# Patient Record
Sex: Female | Born: 1935 | Race: Black or African American | Hispanic: No | Smoking: Never smoker
Health system: Southern US, Community
[De-identification: ages and names within clinical notes are randomized; demographics above are authoritative.]

## PROBLEM LIST (undated history)

## (undated) DIAGNOSIS — G309 Alzheimer's disease, unspecified: Principal | ICD-10-CM

## (undated) DIAGNOSIS — F0280 Dementia in other diseases classified elsewhere without behavioral disturbance: Principal | ICD-10-CM

## (undated) HISTORY — DX: Alzheimer's disease, unspecified: G30.9

## (undated) HISTORY — DX: Dementia in other diseases classified elsewhere without behavioral disturbance: F02.80

---

## 1999-08-08 ENCOUNTER — Encounter: Payer: Self-pay | Admitting: Internal Medicine

## 1999-08-08 ENCOUNTER — Encounter: Admission: RE | Admit: 1999-08-08 | Discharge: 1999-08-08 | Payer: Self-pay | Admitting: Internal Medicine

## 2000-09-03 ENCOUNTER — Encounter: Admission: RE | Admit: 2000-09-03 | Discharge: 2000-09-03 | Payer: Self-pay | Admitting: Internal Medicine

## 2000-09-03 ENCOUNTER — Encounter: Payer: Self-pay | Admitting: Internal Medicine

## 2001-09-21 ENCOUNTER — Encounter: Admission: RE | Admit: 2001-09-21 | Discharge: 2001-09-21 | Payer: Self-pay | Admitting: Internal Medicine

## 2001-09-21 ENCOUNTER — Encounter: Payer: Self-pay | Admitting: Internal Medicine

## 2003-07-12 ENCOUNTER — Encounter: Admission: RE | Admit: 2003-07-12 | Discharge: 2003-07-12 | Payer: Self-pay | Admitting: Internal Medicine

## 2004-10-02 ENCOUNTER — Encounter: Admission: RE | Admit: 2004-10-02 | Discharge: 2004-10-02 | Payer: Self-pay | Admitting: Internal Medicine

## 2005-10-03 ENCOUNTER — Encounter: Admission: RE | Admit: 2005-10-03 | Discharge: 2005-10-03 | Payer: Self-pay | Admitting: Internal Medicine

## 2006-07-26 ENCOUNTER — Emergency Department (HOSPITAL_COMMUNITY): Admission: EM | Admit: 2006-07-26 | Discharge: 2006-07-26 | Payer: Self-pay | Admitting: Emergency Medicine

## 2006-10-06 ENCOUNTER — Encounter: Admission: RE | Admit: 2006-10-06 | Discharge: 2006-10-06 | Payer: Self-pay | Admitting: Internal Medicine

## 2007-09-11 ENCOUNTER — Encounter: Admission: RE | Admit: 2007-09-11 | Discharge: 2007-09-11 | Payer: Self-pay | Admitting: Internal Medicine

## 2007-10-08 ENCOUNTER — Ambulatory Visit (HOSPITAL_COMMUNITY): Admission: RE | Admit: 2007-10-08 | Discharge: 2007-10-08 | Payer: Self-pay | Admitting: Ophthalmology

## 2008-09-12 ENCOUNTER — Encounter: Admission: RE | Admit: 2008-09-12 | Discharge: 2008-09-12 | Payer: Self-pay | Admitting: Internal Medicine

## 2008-09-28 ENCOUNTER — Encounter: Admission: RE | Admit: 2008-09-28 | Discharge: 2008-09-28 | Payer: Self-pay | Admitting: Internal Medicine

## 2008-11-11 ENCOUNTER — Ambulatory Visit (HOSPITAL_COMMUNITY): Admission: RE | Admit: 2008-11-11 | Discharge: 2008-11-11 | Payer: Self-pay | Admitting: Ophthalmology

## 2009-09-13 ENCOUNTER — Ambulatory Visit (HOSPITAL_COMMUNITY): Admission: RE | Admit: 2009-09-13 | Discharge: 2009-09-13 | Payer: Self-pay | Admitting: Internal Medicine

## 2009-09-13 ENCOUNTER — Encounter (INDEPENDENT_AMBULATORY_CARE_PROVIDER_SITE_OTHER): Payer: Self-pay | Admitting: Internal Medicine

## 2009-09-13 ENCOUNTER — Ambulatory Visit: Payer: Self-pay | Admitting: Surgery

## 2009-09-14 ENCOUNTER — Encounter: Admission: RE | Admit: 2009-09-14 | Discharge: 2009-09-14 | Payer: Self-pay | Admitting: Internal Medicine

## 2010-06-29 NOTE — Op Note (Signed)
Shannon Wood, LIPPARD                   ACCOUNT NO.:  [id number]   MEDICAL RECORD NO.:  [id number]          PATIENT TYPE:  AMB   LOCATION:  SDS                          FACILITY:  [facility]   PHYSICIAN:  Salley Scarlet., M.D.DATE OF BIRTH:  [birth year]   DATE OF PROCEDURE:  DATE OF DISCHARGE:  10/08/2007                               OPERATIVE REPORT   PREOPERATIVE DIAGNOSIS:  Immature cataract, left eye.   POSTOPERATIVE DIAGNOSIS:  Immature cataract, left eye.   OPERATION:  Kelman phacoemulsification cataract, left eye.   ANESTHESIA:  Local using Xylocaine 2% with Marcaine 0.75% and Wydase.   JUSTIFICATION FOR PROCEDURE:  This is a 76 year old lady who has  glaucoma who has complained of difficulty seeing to read and drive.  She  was evaluated and found to have bilateral posterior subcapsular  cataracts with a visual acuity best corrected 20/30 on the right and  20/50 on the left.  Cataract extraction with intraocular lens  implantation was recommended.  She is admitted at this time for that  purpose.   PROCEDURE:  Under the influence of IV sedation, Van Lint akinesia and  retrobulbar anesthesia was given.  The patient was prepped and draped in  the usual manner.  The lid speculum was inserted under the upper and  lower lid of the left eye and a 4-0 silk traction suture was passed  through the belly of the superior rectus muscle for traction.  A fornix-  based conjunctival flap was turned, and hemostasis was achieved using  cautery.  An incision made in the sclera at the limbus.  This incision  was dissected down to clear cornea using crescent blade.  A sideport  incision made at the 1:30 position.  OcuCoat was injected into the eye  through the sideport incision.  The anterior chamber was entered through  the corneoscleral tunnel incision at 11:30 o'clock position.  An  anterior capsulotomy was done using bent 25-gauge needle.  The nucleus  was hydrodissected using Xylocaine.  The  KPE handpiece was passed into  the eye and the nucleus was emulsified without difficulty.  The residual  cortical material was aspirated.  The posterior capsule was polished  using an olive tip polisher.  The wound was widened slightly to  accommodate a foldable silicone lens.  The lens seated into the eye  behind the iris without difficulty.  The anterior chamber was reformed  and pupils constricted using Miochol.  The lips of the wound were  hydrated and tested to make sure that there was no leak.  After  ascertaining there was no leak, the conjunctiva was closed over wound  using thermal cautery.  Celestone 1 mL and 0.5 mL of gentamicin were  injected subconjunctivally.  Maxitrol ophthalmic ointment prilocaine  ointment were applied along with a patch and Fox shield.  The patient  tolerated the procedure well and was discharged to the post anesthesia  recovery in satisfactory condition.  She was instructed to rest today,  to take Vicodin every 4 hours as needed for pain, and to see me in  office  tomorrow for further evaluation.   DISCHARGE DIAGNOSIS:  Immature cataract, left eye.     Salley Scarlet., M.D.  Electronically Signed    TB/MEDQ  D:  10/09/2007  T:  10/10/2007  Job:  [id number]

## 2010-08-23 ENCOUNTER — Other Ambulatory Visit: Payer: Self-pay | Admitting: Internal Medicine

## 2010-08-23 DIAGNOSIS — Z1231 Encounter for screening mammogram for malignant neoplasm of breast: Secondary | ICD-10-CM

## 2010-09-18 ENCOUNTER — Ambulatory Visit
Admission: RE | Admit: 2010-09-18 | Discharge: 2010-09-18 | Disposition: A | Discharge status: Home / Self Care | Payer: Medicare Other | Source: Ambulatory Visit | Attending: Internal Medicine | Admitting: Internal Medicine

## 2010-09-18 DIAGNOSIS — Z1231 Encounter for screening mammogram for malignant neoplasm of breast: Secondary | ICD-10-CM

## 2011-09-10 ENCOUNTER — Other Ambulatory Visit: Payer: Self-pay | Admitting: Internal Medicine

## 2011-09-10 DIAGNOSIS — Z1231 Encounter for screening mammogram for malignant neoplasm of breast: Secondary | ICD-10-CM

## 2011-09-24 ENCOUNTER — Ambulatory Visit: Payer: Medicare Other

## 2011-11-04 ENCOUNTER — Ambulatory Visit
Admission: RE | Admit: 2011-11-04 | Discharge: 2011-11-04 | Disposition: A | Discharge status: Home / Self Care | Payer: Medicare Other | Source: Ambulatory Visit | Attending: Internal Medicine | Admitting: Internal Medicine

## 2011-11-04 DIAGNOSIS — Z1231 Encounter for screening mammogram for malignant neoplasm of breast: Secondary | ICD-10-CM

## 2014-03-16 DIAGNOSIS — I10 Essential (primary) hypertension: Secondary | ICD-10-CM | POA: Diagnosis not present

## 2014-05-09 ENCOUNTER — Other Ambulatory Visit: Payer: Self-pay | Admitting: Internal Medicine

## 2014-05-09 DIAGNOSIS — F0390 Unspecified dementia without behavioral disturbance: Secondary | ICD-10-CM

## 2014-05-09 DIAGNOSIS — Z0000 Encounter for general adult medical examination without abnormal findings: Secondary | ICD-10-CM | POA: Diagnosis not present

## 2014-05-13 ENCOUNTER — Other Ambulatory Visit: Payer: Self-pay

## 2014-05-16 ENCOUNTER — Ambulatory Visit
Admission: RE | Admit: 2014-05-16 | Discharge: 2014-05-16 | Disposition: A | Discharge status: Home / Self Care | Payer: Medicare Other | Source: Ambulatory Visit | Attending: Internal Medicine | Admitting: Internal Medicine

## 2014-05-16 DIAGNOSIS — F0390 Unspecified dementia without behavioral disturbance: Secondary | ICD-10-CM

## 2014-05-16 DIAGNOSIS — R413 Other amnesia: Secondary | ICD-10-CM | POA: Diagnosis not present

## 2014-05-23 DIAGNOSIS — F0390 Unspecified dementia without behavioral disturbance: Secondary | ICD-10-CM | POA: Diagnosis not present

## 2014-06-20 ENCOUNTER — Ambulatory Visit (INDEPENDENT_AMBULATORY_CARE_PROVIDER_SITE_OTHER): Payer: Medicare Other | Admitting: Neurology

## 2014-06-20 ENCOUNTER — Encounter: Payer: Self-pay | Admitting: Neurology

## 2014-06-20 VITALS — BP 140/68 | HR 64 | Ht 64.0 in | Wt 135.8 lb

## 2014-06-20 DIAGNOSIS — G300 Alzheimer's disease with early onset: Secondary | ICD-10-CM | POA: Diagnosis not present

## 2014-06-20 DIAGNOSIS — F0280 Dementia in other diseases classified elsewhere without behavioral disturbance: Secondary | ICD-10-CM

## 2014-06-20 DIAGNOSIS — G309 Alzheimer's disease, unspecified: Secondary | ICD-10-CM | POA: Diagnosis not present

## 2014-06-20 HISTORY — DX: Dementia in other diseases classified elsewhere without behavioral disturbance: F02.80

## 2014-06-20 NOTE — Patient Instructions (Signed)
Alzheimer Disease Alzheimer disease is a mental disorder. It causes memory loss and loss of other mental functions, such as learning, thinking, problem solving, communicating, and completing tasks. The mental losses interfere with the ability to perform daily activities at work, at home, or in social situations. Alzheimer disease usually starts in a person's late 60s or early 70s but can start earlier in life (familial form). The mental changes caused by this disease are permanent and worsen over time. As the illness progresses, the ability to do even the simplest things is lost. Survival with Alzheimer disease ranges from several years to as long as 20 years. CAUSES Alzheimer disease is caused by abnormally high levels of a protein (beta-amyloid) in the brain. This protein forms very small deposits within and around the brain's nerve cells. These deposits prevent the nerve cells from working properly. Experts are not certain what causes the beta-amyloid deposits in this disease. RISK FACTORS The following major risk factors have been identified:  Increasing age.  Certain genetic variations, such as Down syndrome (trisomy 21). SYMPTOMS In the early stages of Alzheimer disease, you are still able to perform daily activities but need greater effort, more time, or memory aids. Early symptoms include:  Mild memory loss of recent events, names, or phone numbers.  Loss of objects.  Minor loss of vocabulary.  Difficulty with complex tasks, such as paying bills or driving in unfamiliar locations. Other mental functions deteriorate as the disease worsens. These changes slowly go from mild to severe. Symptoms at this stage include:  Difficulty remembering. You may not be able to recall personal information such as your address and telephone number. You may become confused about the date, the season of the year, or your location.  Difficulty maintaining attention. You may forget what you wanted to say  during conversations and repeat what you have already said.  Difficulty learning new information or tasks. You may not remember what you read or the name of a new friend you met.  Difficulty counting or doing math. You may have difficulty with complex math problems. You may make mistakes in paying bills or managing your checkbook.  Poor reasoning and judgment. You may make poor decisions or not dress right for the weather.  Difficulty communicating. You may have regular difficulty remembering words, naming objects, expressing yourself clearly, or writing sentences that make sense.  Difficulty performing familiar daily activities. You may get lost driving in familiar locations or need help eating, bathing, dressing, grooming, or using the toilet. You may have difficulty maintaining bladder or bowel control.  Difficulty recognizing familiar faces. You may confuse family members or close friends with one another. You may not recognize a close relative or may mistake strangers for family. Alzheimer disease also may cause changes in personality and behavior. These changes include:   Loss of interest or motivation.  Social withdrawal.  Anxiety.  Difficulty sleeping.  Uncharacteristic anger or combativeness.  A false belief that someone is trying to harm you (paranoia).  Seeing things that are not real (hallucinations).  Agitation. Confusion and disruptive behavior are often worse at night and may be triggered by changes in the environment or acute medical issues. DIAGNOSIS  Alzheimer disease is diagnosed through an assessment by your health care provider. During this assessment, your health care provider will do the following:  Ask you and your family, friends, or caregivers questions about your symptoms, their frequency, their duration and progression, and the effect they are having on your life.    Ask questions about your personal and family medical history and use of alcohol or drugs,  including prescription medicine.  Perform a physical exam and order blood tests and brain imaging exams. Your health care provider may refer you to a specialist for detailed evaluation of your mental functions (neuropsychological testing).  Many different brain disorders, medical conditions, and certain substances can cause symptoms that resemble Alzheimer disease symptoms. These must be ruled out before this disease can be diagnosed. If Alzheimer disease is diagnosed, it will be considered either "possible" or "probable" Alzheimer disease. "Possible" Alzheimer disease means that your symptoms are typical of the disease and no other disorder is causing them. "Probable" Alzheimer disease means that you also have a family history of the disease or genetic test results that support the diagnosis. Certain tests, mostly used in research studies, are highly specific for Alzheimer disease.  TREATMENT  There is currently no cure for this disease. The goals of treatment are to:  Slow down the progression of the disease.  Preserve mental function as long as possible.  Manage behavioral symptoms.  Make life easier for the person with Alzheimer disease and his or her caregivers. The following treatment options are available:  Medicine. Certain medicines may help slow memory loss by changing the level of certain chemicals in the brain. Medicine may also help with behavioral symptoms.  Talk therapy. Talk therapy provides education, support, and memory aids for people with this disease. It is most effective in the early stages of the illness.  Caregiving. Caregivers may be family members, friends, or trained medical professionals. They help the person with Alzheimer disease with daily life activities. Caregiving may take place at home or at a nursing facility.  Family support groups. These provide education, emotional support, and information about community resources to family members who are taking care of  the person with this disease. Document Released: 10/10/2003 Document Revised: 06/14/2013 Document Reviewed: 06/05/2012 ExitCare Patient Information 2015 [employer]. This information is not intended to replace advice given to you by your health care provider. Make sure you discuss any questions you have with your health care provider.  

## 2014-06-20 NOTE — Progress Notes (Signed)
Reason for visit: Memory disturbance  Referring physician: Dr. Louann Sjogrenon Roberts  Shannon Wood is a 80 y.o. female  History of present illness:  Shannon Wood is a 80 year old right-handed black female with a history of a progressive memory disturbance that has been present for at least 3 years. The patient has not noted any problems with her memory herself. The patient lives at home alone. She does have a daughter that lives 2 blocks away, but her daughter works long hours. The patient has had issues with driving at times with directions, and has had safety issues at home. The patient will leave a pot on the stove, and she will misplace things about the house frequently. She has had problems managing her finances, and she has spent over $100,000 on various financial scams. The patient is having increasing paranoid behavior. The patient will keep the curtains closed at all times. 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 She denies any problems with fatigue or difficulty with numbness or weakness of the extremities. The patient does not have any balance issues, she may have some urgency of the bladder at times. She has undergone a CT scan of the brain that was unremarkable. Blood work has been done. She has been placed on low-dose Aricept, and she is sent to this office for an evaluation.  Past Medical History  Diagnosis Date  . Alzheimer's disease 06/20/2014    History reviewed. No pertinent past surgical history.  Family History  Problem Relation Age of Onset  . Diabetes Father   . Hypertension Father   . Kidney disease Brother   . Dementia Neg Hx     Social history:  reports that she has never smoked. She has never used smokeless tobacco. She reports that she does not drink alcohol or use illicit drugs.  Medications:  Prior to Admission medications   Medication Sig Start Date End Date Taking? Authorizing Provider  ALPRAZolam  (XANAX) 0.25 MG tablet Take 0.25 mg by mouth 2 (two) times daily. 05/23/14  Yes Historical Provider, MD  donepezil (ARICEPT) 5 MG tablet Take 5 mg by mouth daily. 05/23/14  Yes Historical Provider, MD     No Known Allergies  ROS:  Out of a complete 14 system review of symptoms, the patient complains only of the following symptoms, and all other reviewed systems are negative.  Insomnia Memory loss Confusion  Blood pressure 140/68, pulse 64, height 5\' 4"  (1.626 m), weight 135 lb 12.8 oz (61.598 kg).  Physical Exam  General: The patient is alert and cooperative at the time of the examination.  Eyes: Pupils are equal, round, and reactive to light. Discs are flat bilaterally.  Neck: The neck is supple, no carotid bruits are noted.  Respiratory: The respiratory examination is clear.  Cardiovascular: The cardiovascular examination reveals a regular rate and rhythm, no obvious murmurs or rubs are noted.  Skin: Extremities are without significant edema.  Neurologic Exam  Mental status: The patient is alert and oriented x 3 at the time of the examination. The Mini-Mental Status Examination done today shows a total score of 19/30.  Cranial nerves: Facial symmetry is present. There is good sensation of the face to pinprick and soft touch bilaterally. The strength of the facial muscles and the muscles to head turning and shoulder shrug are normal bilaterally. Speech is well enunciated, no aphasia or dysarthria is noted. Extraocular movements are full. Visual  fields are full. The tongue is midline, and the patient has symmetric elevation of the soft palate. No obvious hearing deficits are noted.  Motor: The motor testing reveals 5 over 5 strength of all 4 extremities. Good symmetric motor tone is noted throughout.  Sensory: Sensory testing is intact to pinprick, soft touch, vibration sensation, and position sense on all 4 extremities. No evidence of extinction is noted.  Coordination:  Cerebellar testing reveals good finger-nose-finger and heel-to-shin bilaterally. Slight apraxia is noted with the use of the extremities.  Gait and station: Gait is normal. Tandem gait is slightly unsteady. Romberg is negative. No drift is seen.  Reflexes: Deep tendon reflexes are symmetric and normal bilaterally. Toes are downgoing bilaterally.   CT head 05/16/14:  IMPRESSION: No acute intracranial abnormality and negative for age noncontrast CT appearance of the brain; mild nonspecific white matter changes.  * CT scan images were reviewed online. I agree with the written report.    Assessment/Plan:  1. Alzheimer's disease  The patient has been generally fairly healthy medically. The patient has had a progressive decline in her memory recently. The patient has been placed on Aricept at 5 mg, she can be increased after one month to a 10 mg dose. The patient will follow-up in about 6 months. I will try to get aid and assistance in the home environment. The patient's family is trying to get healthcare and financial power of attorney. The finances need to be protected for this patient. The patient will need increasing levels of supervision during the day, and eventually she may need to transfer to an extended care facility. The patient is not to operate a motor vehicle at this time.  Marlan Palau. Keith Tequan Redmon MD 06/20/2014 7:45 PM  [facility] [address] [place], [place] [id number]  Phone [phone] Fax [phone]

## 2014-07-04 ENCOUNTER — Telehealth: Payer: Self-pay | Admitting: Neurology

## 2014-07-04 ENCOUNTER — Telehealth: Payer: Self-pay

## 2014-07-04 NOTE — Telephone Encounter (Signed)
Pt's son Karren Burly(Dwight) called wanting to follow up on referral regarding home health aid. Please call and advise. Karren BurlyDwight can be reached at [phone]. He will be with her this weekend.

## 2014-07-04 NOTE — Telephone Encounter (Signed)
Faxed order and notes to [facility] att. Tiffany [phone]. Shannon Wood will try and work out with son to come out on this Friday for Home evaluation for Home health Aide when Her son Karren BurlyDwight is  in town .

## 2014-07-04 NOTE — Telephone Encounter (Signed)
I returned the patient's son's call. The order was placed under "other orders" rather than "referrals." Annabelle HarmanDana has sent the referral and all of the patient's information to [facility]. They are aware that the patient's son will be in town this weekend and stated it should not be a problem to come to the home while the son is here. The son is aware of all of this and will call our office if he does not hear from them in a couple of days.

## 2014-07-07 ENCOUNTER — Telehealth: Payer: Self-pay | Admitting: Neurology

## 2014-07-07 NOTE — Telephone Encounter (Signed)
Shannon BeringAmber K from [facility] called regarding a referral that was sent for in home health care. She needs clarification on instructions. Please call and advise. Amber K can be reached @ [phone]

## 2014-07-07 NOTE — Telephone Encounter (Signed)
I called Amber. She stated that since the patient has Medicaid, a Medicaid nurse would have to go to the home to do an evaluation first to determine if the patient qualified for the CAP or PCS program. Amber told me to have the patient call the Department of Social Services at [phone] to set up a time for the nurse to come out. Once the nurse evaluates the patient she should let [facility] know what is approved. I called the patient's son and gave him all of this information. He will call me back if he has any trouble with this process.

## 2014-07-07 NOTE — Telephone Encounter (Signed)
Tiffany called and requested to speak with Darreld Mcleanana Cox regarding a referral for the pt. Please call and advise.

## 2014-07-08 NOTE — Telephone Encounter (Signed)
[facility] . [facility] has denied because of insurance. I will try some other places to get patient scheduled.

## 2014-07-08 NOTE — Telephone Encounter (Signed)
Called Tiffany back left her a message to me back .

## 2014-07-13 NOTE — Telephone Encounter (Signed)
I received a message to call patient's son, Leanor RubensteinDwight Ponce. I called him and he stated that he received a message to call our office. I explained to him that Annabelle HarmanDana was probably the one who called and I would send this to her (and [place]) and get her to call him back.

## 2014-07-13 NOTE — Telephone Encounter (Signed)
Spoke to BlaineDwight Kleine and they are going to call medicaid and see what the coverage is so I can send the order . Places that are not taking her insurance - [facility], [facility]  [facility] care . I stated to son and daughter to please call insurance and see who her insurance accepts and I will send the order asap. I will call the daughter and son back today at 4:00 pm to see what they come up with.

## 2014-07-14 NOTE — Telephone Encounter (Signed)
Debbie called back from patient's insurance and relayed to me that I needed to call [facility]  Care at [phone] and ask for a waiver form /DMA [id number] form.  [facility]ed [facility] and spoke to RittmanFernandez. And he relayed that the doctor has to fill out form and they will provide home care / Aide this process will take maybe one week after we get form back. [facility] will Karren BurlyDwight to schedule visit. I will give to form to [facility] process.  Called Dwight son and relayed process and he is aware of all details.

## 2014-07-14 NOTE — Telephone Encounter (Signed)
Called and spoke to Clear Channel CommunicationsDe wight  And he has not called insurance company yet. Per his request give him a week.

## 2014-07-14 NOTE — Telephone Encounter (Signed)
I'll be happy to fill out the form once it is received.

## 2014-07-14 NOTE — Telephone Encounter (Signed)
Called patient's insurance and spoke to BoontonDeb. Relayed patient needs help and we need to know who covers for her home health. Deb. Has all patient's information and she will call me back regarding which company can go to see patient. Called Patient's son Karren BurlyDwight and relayed process he understood.

## 2014-07-15 NOTE — Telephone Encounter (Signed)
Form completed, signed and returned to ArvadaDana.

## 2014-07-15 NOTE — Telephone Encounter (Signed)
Form faxed to [phone] waiting on status.

## 2014-12-19 ENCOUNTER — Ambulatory Visit: Payer: Self-pay | Admitting: Neurology

## 2015-03-14 ENCOUNTER — Ambulatory Visit: Payer: Medicare Other | Admitting: Neurology

## 2015-03-14 ENCOUNTER — Ambulatory Visit (INDEPENDENT_AMBULATORY_CARE_PROVIDER_SITE_OTHER): Payer: Medicare Other | Admitting: Neurology

## 2015-03-14 ENCOUNTER — Encounter: Payer: Self-pay | Admitting: Neurology

## 2015-03-14 VITALS — BP 136/70 | HR 69 | Ht 64.0 in | Wt 158.0 lb

## 2015-03-14 DIAGNOSIS — G301 Alzheimer's disease with late onset: Secondary | ICD-10-CM

## 2015-03-14 DIAGNOSIS — F0280 Dementia in other diseases classified elsewhere without behavioral disturbance: Secondary | ICD-10-CM

## 2015-03-14 MED ORDER — TRAZODONE HCL 50 MG PO TABS: 50.0000 mg | ORAL_TABLET | Freq: Every day | ORAL | Status: DC

## 2015-03-14 NOTE — Progress Notes (Signed)
Reason for visit:  Alzheimer's disease  Shannon Wood is an 81 y.o. female  History of present illness:   Shannon Wood is a 81 year old right-handed black female with a history of a progressive memory disturbance consistent without Alzheimer's disease. The patient lives alone at home, her daughter lives across the street, but she has to work during the day. The patient is maintaining good safety, she is no longer operating a motor vehicle. She is not cooking. She does not have a tendency to wander. She will go to bed around 7:30 PM, she wakes up around 1 AM and then will clean the house, she may go back to sleep early in the morning. The patient now has a power of attorney for finances, her family is managing the bills. She is on Aricept taking one half tablet twice daily, it is not clear whether she is on a 5 mg tablet or a 10 mg tablet. She has been maintaining her weight fairly well. She returns to this office for an evaluation.  The family has been trying to get aid and assistance in the home environment, our office is contacted at least 3 home health care agencies without success.  Past Medical History  Diagnosis Date  . Alzheimer's disease 06/20/2014    History reviewed. No pertinent past surgical history.  Family History  Problem Relation Age of Onset  . Diabetes Father   . Hypertension Father   . Kidney disease Brother   . Dementia Neg Hx     Social history:  reports that she has never smoked. She has never used smokeless tobacco. She reports that she does not drink alcohol or use illicit drugs.   No Known Allergies  Medications:  Prior to Admission medications   Medication Sig Start Date End Date Taking? Authorizing Provider  ALPRAZolam (XANAX) 0.25 MG tablet Take 0.25 mg by mouth 2 (two) times daily. 05/23/14  Yes Historical Provider, MD  donepezil (ARICEPT) 5 MG tablet Take 5 mg by mouth daily. 05/23/14  Yes Historical Provider, MD    ROS:  Out of a complete 14 system  review of symptoms, the patient complains only of the following symptoms, and all other reviewed systems are negative.   Memory disturbance  Blood pressure 136/70, pulse 69, height  (1.626 m), weight 158 lb (71.668 kg).  Physical Exam  General: The patient is alert and cooperative at the time of the examination.  Skin: No significant peripheral edema is noted.   Neurologic Exam  Mental status: The patient is alert and oriented x 3 at the time of the examination. The MMSE done today shows a score of 18/30, the patient is able to name 4 animals in 30 seconds.   Cranial nerves: Facial symmetry is present. Speech is normal, no aphasia or dysarthria is noted. Extraocular movements are full. Visual fields are full.  Motor: The patient has good strength in all 4 extremities.  Sensory examination: Soft touch sensation is symmetric on the face, arms, and legs.  Coordination: The patient has good finger-nose-finger and heel-to-shin bilaterally.  The patient has some apraxia with the use of the lower extremities.  Gait and station: The patient has a normal gait. Tandem gait is slightly unsteady. Romberg is negative. No drift is seen.  Reflexes: Deep tendon reflexes are symmetric, but are depressed.   Assessment/Plan:   1. Alzheimer's disease   The patient will continue the Aricept, the family will check to make sure that she is on  10 mg daily. If not, we will increase the dose. The patient will be given trazodone 50 mg at night for sleep. She will follow-up in 6 months.  Marlan Palau MD 03/14/2015 11:59 AM  [facility] [address] [place], [place] [address]  Phone [phone] Fax [phone]

## 2015-03-14 NOTE — Patient Instructions (Signed)
Alzheimer Disease °Alzheimer disease is a mental disorder. It causes memory loss and loss of other mental functions, such as learning, thinking, problem solving, communicating, and completing tasks. The mental losses interfere with the ability to perform daily activities at work, at home, or in social situations. °Alzheimer disease usually starts in a person's late 60s or early 70s but can start earlier in life (familial form). The mental changes caused by this disease are permanent and worsen over time. As the illness progresses, the ability to do even the simplest things is lost. Survival with Alzheimer disease ranges from several years to as long as 20 years. °CAUSES °Alzheimer disease is caused by abnormally high levels of a protein (beta-amyloid) in the brain. This protein forms very small deposits within and around the brain's nerve cells. These deposits prevent the nerve cells from working properly. Experts are not certain what causes the beta-amyloid deposits in this disease. °RISK FACTORS °The following major risk factors have been identified: °· Increasing age. °· Certain genetic variations, such as Down syndrome (trisomy 21). °SYMPTOMS °In the early stages of Alzheimer disease, you are still able to perform daily activities but need greater effort, more time, or memory aids. Early symptoms include: °· Mild memory loss of recent events, names, or phone numbers. °· Loss of objects. °· Minor loss of vocabulary. °· Difficulty with complex tasks, such as paying bills or driving in unfamiliar locations. °Other mental functions deteriorate as the disease worsens. These changes slowly go from mild to severe. Symptoms at this stage include: °· Difficulty remembering. You may not be able to recall personal information such as your address and telephone number. You may become confused about the date, the season of the year, or your location. °· Difficulty maintaining attention. You may forget what you wanted to say  during conversations and repeat what you have already said. °· Difficulty learning new information or tasks. You may not remember what you read or the name of a new friend you met. °· Difficulty counting or doing math. You may have difficulty with complex math problems. You may make mistakes in paying bills or managing your checkbook. °· Poor reasoning and judgment. You may make poor decisions or not dress right for the weather. °· Difficulty communicating. You may have regular difficulty remembering words, naming objects, expressing yourself clearly, or writing sentences that make sense. °· Difficulty performing familiar daily activities. You may get lost driving in familiar locations or need help eating, bathing, dressing, grooming, or using the toilet. You may have difficulty maintaining bladder or bowel control. °· Difficulty recognizing familiar faces. You may confuse family members or close friends with one another. You may not recognize a close relative or may mistake strangers for family. °Alzheimer disease also may cause changes in personality and behavior. These changes include:  °· Loss of interest or motivation. °· Social withdrawal. °· Anxiety. °· Difficulty sleeping. °· Uncharacteristic anger or combativeness. °· A false belief that someone is trying to harm you (paranoia). °· Seeing things that are not real (hallucinations). °· Agitation. °Confusion and disruptive behavior are often worse at night and may be triggered by changes in the environment or acute medical issues. °DIAGNOSIS  °Alzheimer disease is diagnosed through an assessment by your health care provider. During this assessment, your health care provider will do the following: °· Ask you and your family, friends, or caregivers questions about your symptoms, their frequency, their duration and progression, and the effect they are having on your life. °·   Ask questions about your personal and family medical history and use of alcohol or drugs,  including prescription medicine.  Perform a physical exam and order blood tests and brain imaging exams. Your health care provider may refer you to a specialist for detailed evaluation of your mental functions (neuropsychological testing).  Many different brain disorders, medical conditions, and certain substances can cause symptoms that resemble Alzheimer disease symptoms. These must be ruled out before this disease can be diagnosed. If Alzheimer disease is diagnosed, it will be considered either "possible" or "probable" Alzheimer disease. "Possible" Alzheimer disease means that your symptoms are typical of the disease and no other disorder is causing them. "Probable" Alzheimer disease means that you also have a family history of the disease or genetic test results that support the diagnosis. Certain tests, mostly used in research studies, are highly specific for Alzheimer disease.  TREATMENT  There is currently no cure for this disease. The goals of treatment are to:  Slow down the progression of the disease.  Preserve mental function as long as possible.  Manage behavioral symptoms.  Make life easier for the person with Alzheimer disease and his or her caregivers. The following treatment options are available:  Medicine. Certain medicines may help slow memory loss by changing the level of certain chemicals in the brain. Medicine may also help with behavioral symptoms.  Talk therapy. Talk therapy provides education, support, and memory aids for people with this disease. It is most effective in the early stages of the illness.  Caregiving. Caregivers may be family members, friends, or trained medical professionals. They help the person with Alzheimer disease with daily life activities. Caregiving may take place at home or at a nursing facility.  Family support groups. These provide education, emotional support, and information about community resources to family members who are taking care of  the person with this disease.   This information is not intended to replace advice given to you by your health care provider. Make sure you discuss any questions you have with your health care provider.   Document Released: 10/10/2003 Document Revised: 02/18/2014 Document Reviewed: 06/05/2012 Elsevier Interactive Patient Education [employer].

## 2015-05-28 ENCOUNTER — Other Ambulatory Visit: Payer: Self-pay | Admitting: Neurology

## 2015-07-02 ENCOUNTER — Other Ambulatory Visit: Payer: Self-pay | Admitting: Neurology

## 2015-09-07 ENCOUNTER — Telehealth: Payer: Self-pay | Admitting: Adult Health

## 2015-09-07 MED ORDER — TRAZODONE HCL 50 MG PO TABS: 50.0000 mg | ORAL_TABLET | Freq: Every day | ORAL | 0 refills | Status: AC

## 2015-09-07 MED ORDER — DONEPEZIL HCL 10 MG PO TABS: 10.0000 mg | ORAL_TABLET | Freq: Every day | ORAL | 0 refills | Status: DC

## 2015-09-07 NOTE — Telephone Encounter (Signed)
Spoke to son, Karren Burly.  He comes with pt for appt.  His wife in hospital for surgery for heart transplant, needed to reschedule appt.  To 10-23-15.  Refills donezepil 10mg  po daily and trazodone 50mg  po qhs.  (90 days).  No refill.

## 2015-09-07 NOTE — Telephone Encounter (Signed)
I just called Lee-Ann son to reminder him about her appt.Marland Kitchen He can not make the appt for July 31 and wanted to r/s I did do that for a time that was good for him to come in with her.. He wanted the nurse to call him and see if there was any of her medicine that need to be refill because he is not sure..The best number to contact her son is [phone]

## 2015-09-11 ENCOUNTER — Ambulatory Visit: Payer: Medicare Other | Admitting: Adult Health

## 2015-10-23 ENCOUNTER — Ambulatory Visit: Payer: Medicare Other | Admitting: Adult Health

## 2015-11-27 ENCOUNTER — Other Ambulatory Visit: Payer: Self-pay | Admitting: Neurology

## 2017-02-13 ENCOUNTER — Emergency Department (HOSPITAL_COMMUNITY): Payer: Medicare Other

## 2017-02-13 ENCOUNTER — Emergency Department (HOSPITAL_COMMUNITY)
Admission: EM | Admit: 2017-02-13 | Discharge: 2017-02-13 | Disposition: A | Discharge status: Home / Self Care | Payer: Medicare Other | Attending: Emergency Medicine | Admitting: Emergency Medicine

## 2017-02-13 DIAGNOSIS — Z79899 Other long term (current) drug therapy: Secondary | ICD-10-CM | POA: Insufficient documentation

## 2017-02-13 DIAGNOSIS — R55 Syncope and collapse: Secondary | ICD-10-CM | POA: Diagnosis present

## 2017-02-13 DIAGNOSIS — G309 Alzheimer's disease, unspecified: Secondary | ICD-10-CM | POA: Insufficient documentation

## 2017-02-13 DIAGNOSIS — R251 Tremor, unspecified: Secondary | ICD-10-CM | POA: Diagnosis not present

## 2017-02-13 LAB — BASIC METABOLIC PANEL
Anion gap: 12 (ref 5–15)
BUN: 31 mg/dL — ABNORMAL HIGH (ref 6–20)
CHLORIDE: 100 mmol/L — AB (ref 101–111)
CO2: 25 mmol/L (ref 22–32)
Calcium: 9.1 mg/dL (ref 8.9–10.3)
Creatinine, Ser: 1.3 mg/dL — ABNORMAL HIGH (ref 0.44–1.00)
GFR calc non Af Amer: 38 mL/min — ABNORMAL LOW (ref >60)
GFR, EST AFRICAN AMERICAN: 44 mL/min — AB (ref >60)
Glucose, Bld: 153 mg/dL — ABNORMAL HIGH (ref 65–99)
POTASSIUM: 3.7 mmol/L (ref 3.5–5.1)
Sodium: 137 mmol/L (ref 135–145)

## 2017-02-13 LAB — URINALYSIS, ROUTINE W REFLEX MICROSCOPIC
Bilirubin Urine: NEGATIVE
Glucose, UA: 50 mg/dL — AB
Hgb urine dipstick: NEGATIVE
Ketones, ur: NEGATIVE mg/dL
Leukocytes, UA: NEGATIVE
NITRITE: NEGATIVE
PH: 5 (ref 5.0–8.0)
Protein, ur: NEGATIVE mg/dL
Specific Gravity, Urine: 1.025 (ref 1.005–1.030)

## 2017-02-13 LAB — CBC
HEMATOCRIT: 35.5 % — AB (ref 36.0–46.0)
HEMOGLOBIN: 11.4 g/dL — AB (ref 12.0–15.0)
MCH: 30.7 pg (ref 26.0–34.0)
MCHC: 32.1 g/dL (ref 30.0–36.0)
MCV: 95.7 fL (ref 78.0–100.0)
Platelets: 401 10*3/uL — ABNORMAL HIGH (ref 150–400)
RBC: 3.71 MIL/uL — ABNORMAL LOW (ref 3.87–5.11)
RDW: 13.4 % (ref 11.5–15.5)
WBC: 19.1 10*3/uL — ABNORMAL HIGH (ref 4.0–10.5)

## 2017-02-13 MED ORDER — SODIUM CHLORIDE 0.9 % IV BOLUS (SEPSIS)
500.0000 mL | Freq: Once | INTRAVENOUS | Status: AC
  Administered 2017-02-13: 500 mL via INTRAVENOUS

## 2017-02-13 NOTE — ED Notes (Signed)
We were unable to attain and SPO2 for this pt after several attempts

## 2017-02-13 NOTE — ED Provider Notes (Signed)
Care of this patient was taken over from Dr. Rosalia Hammersay.  This is a 83 year old lady with dementia who had a brief shaking spells/near syncope.  She was back to baseline immediately after.  There is no definitive tonic-clonic seizure activity.  She has been baseline throughout the ED course.  She is afebrile.  She does have an elevation in her WBC count but I do not see any signs of infection.  I examined her skin and there is no evidence of cellulitis.  Her chest x-ray is clear without evidence of pneumonia.  Her urine is negative.  She has no diminished mental status or other change in behavior that would be more concerning for infection.  She is neurologically intact other than the dementia.  Her labs here are non-concerning.  Her head CT is negative.  She was discharged back to the nursing home.  I had a long discussion with the daughter about the results.  Patient will return for any worsening symptoms.   Shannon BuccoBelfi, Shannon Argyle, MD 02/13/17 [id number]

## 2017-02-13 NOTE — ED Triage Notes (Signed)
Per EMS pt lives with daughter and caregiver, today at home her caregiver stated that while she was eating she had an episode where she looked straight and rose her arms in the air and started to shake them. NAD noted at this time Pt is oriented to self and has dementia.

## 2017-02-13 NOTE — ED Notes (Signed)
Pt leaving with daughter, she is AO to baseline NAD noted at this time.  All belongings taken with pt.

## 2017-02-13 NOTE — ED Provider Notes (Signed)
[facility] EMERGENCY DEPARTMENT Provider Note   CSN: [id number] Arrival date & time: 02/13/17  1326     History   Chief Complaint Chief Complaint  Patient presents with  . Near Syncope    HPI Shannon Wood is a 83 y.o. female. Level 5 caveat secondary to dementia HPI  83 year old female history of dementia from home with reports that she had an episode of staring with arms raised and then slumped forward.  They report that she is currently back to baseline.  EMS reports that she was with a caregiver.  She lives at home with her daughter but a caregiver is present during the days.  Report no prior history of seizures.  Past Medical History:  Diagnosis Date  . Alzheimer's disease 06/20/2014    Patient Active Problem List   Diagnosis Date Noted  . Alzheimer's disease 06/20/2014    No past surgical history on file.  OB History    No data available       Home Medications    Prior to Admission medications   Medication Sig Start Date End Date Taking? Authorizing Provider  ALPRAZolam (XANAX) 0.25 MG tablet Take 0.25 mg by mouth 2 (two) times daily. 05/23/14   [provider]  donepezil (ARICEPT) 10 MG tablet TAKE 1 TABLET(10 MG) BY MOUTH AT BEDTIME 11/27/15   York Spaniel, MD  traZODone (DESYREL) 50 MG tablet Take 1 tablet (50 mg total) by mouth at bedtime. 09/07/15   York Spaniel, MD    Family History Family History  Problem Relation Age of Onset  . Diabetes Father   . Hypertension Father   . Kidney disease Brother   . Dementia Neg Hx     Social History Social History   Tobacco Use  . Smoking status: Never Smoker  . Smokeless tobacco: Never Used  Substance Use Topics  . Alcohol use: No  . Drug use: No     Allergies   Patient has no known allergies.   Review of Systems Review of Systems  Unable to perform ROS: Dementia     Physical Exam Updated Vital Signs BP 120/60 (BP Location: Right Arm)   Pulse 91   Temp  98.5 F (36.9 C) (Axillary)   Resp 16   Ht 1.626 m (5\' 4" )   Wt 68 kg (150 lb)   SpO2 98%   BMI 25.75 kg/m   Physical Exam  Constitutional: She appears well-developed and well-nourished. No distress.  HENT:  Head: Normocephalic and atraumatic.  Right Ear: External ear normal.  Left Ear: External ear normal.  Nose: Nose normal.  Mouth/Throat: Oropharynx is clear and moist.  No tongue or cheek abrasion noted  Eyes:  With abnormal pupil her surgery  Neck: Normal range of motion. Neck supple.  Cardiovascular: Normal rate and regular rhythm.  Pulmonary/Chest: Effort normal and breath sounds normal.  Abdominal: Soft. Bowel sounds are normal.  Musculoskeletal:  She does not voluntarily range her legs but I am able to range and without difficulty.  The signs of fracture or tenderness to palpation  Neurological: She is alert.  Patient is oriented to person but not to place or date focal abnormalities are seen.  Skin: Skin is warm and dry. Capillary refill takes less than 2 seconds.  Bilateral hip pressure ulcers are noted  Nursing note and vitals reviewed.    ED Treatments / Results  Labs (all labs ordered are listed, but only abnormal results are displayed) Labs Reviewed  CBC  BASIC METABOLIC PANEL    EKG  EKG Interpretation None       Radiology No results found.  Procedures Procedures (including critical care time)  Medications Ordered in ED Medications - No data to display   Initial Impression / Assessment and Plan / ED Course  I have reviewed the triage vital signs and the nursing notes.  Pertinent labs & imaging results that were available during my care of the patient were reviewed by me and considered in my medical decision making (see chart for details).     Discussed  with daughter who is now present.  She had a similar history from caregiver.  She states her mother has had some similar shaking events when she has been cold in the past.  I discussed  that she appears stable here and if labs are normal she will be discharged home.  Daughter voices agreement with plan.  Patient checked out to Dr. Fredderick PhenixBelfi.  Final Clinical Impressions(s) / ED Diagnoses   Final diagnoses:  Episode of shaking    ED Discharge Orders    None       Margarita Grizzleay, Der Gagliano, MD 02/13/17 1654

## 2017-04-11 DEATH — deceased

## 2018-05-21 IMAGING — CT CT HEAD W/O CM
4 of 6 series · 16 of 47 positions shown, 18 images · non-contrast
Comparison: CT head 05/16/2014

CLINICAL DATA: Altered level of consciousness.

EXAM:
CT HEAD WITHOUT CONTRAST
TECHNIQUE: Contiguous axial images were obtained from the base of the skull
through the vertex without intravenous contrast.

[Series 3: head without · axial · non-contrast · 0.43mm/px · z∈[-60,+35]mm · 5 of 29 slices shown, 7 images]
[im 5/29  brain]
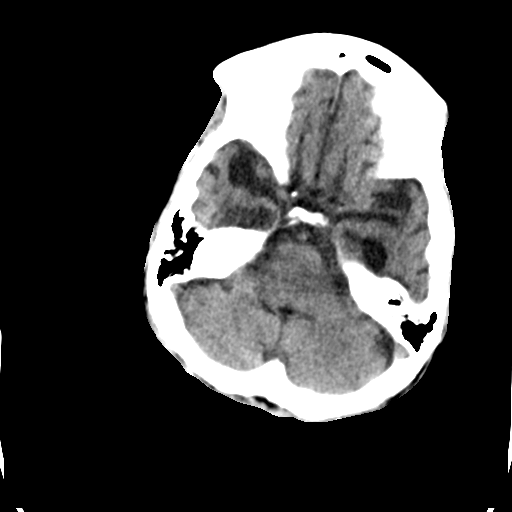
[im 5/29  bone]
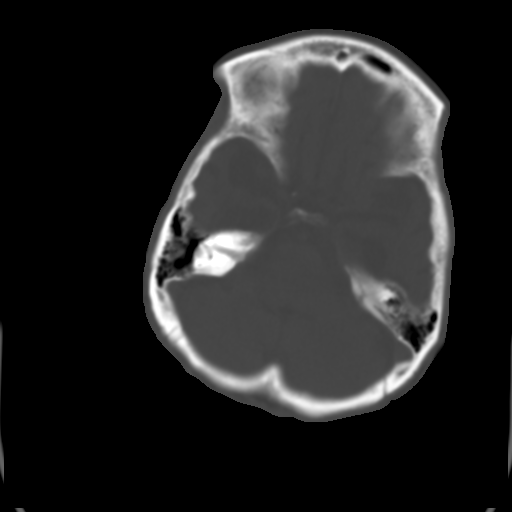
[im 10/29  brain]
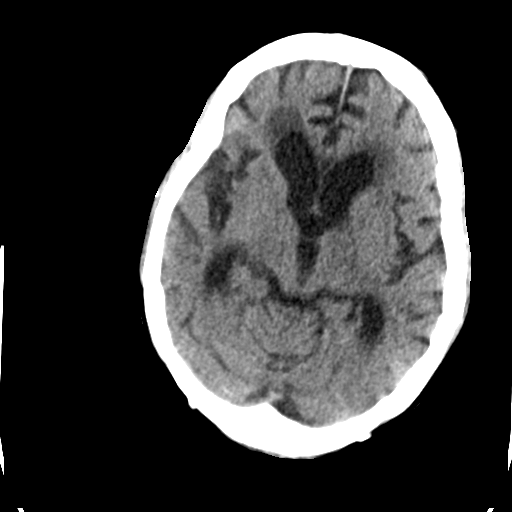
[im 15/29  brain]
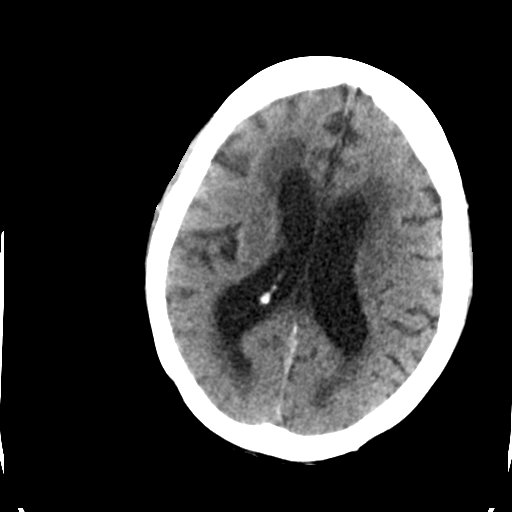
[im 19/29  brain]
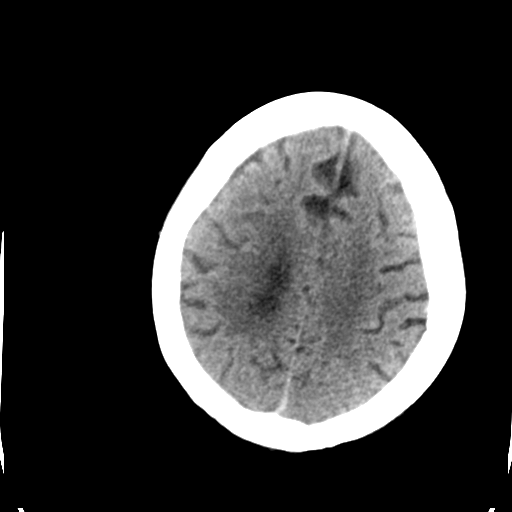
[im 24/29  brain]
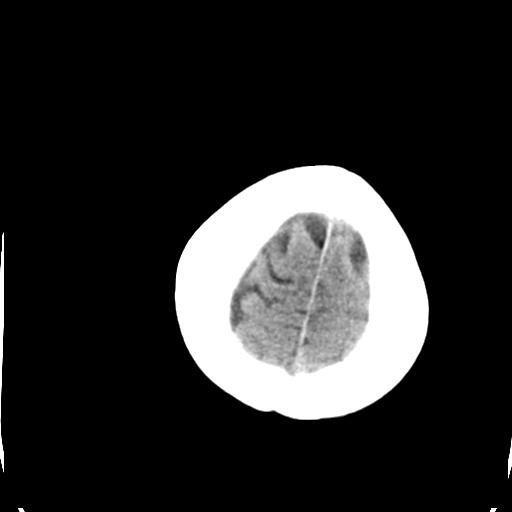
[im 24/29  bone]
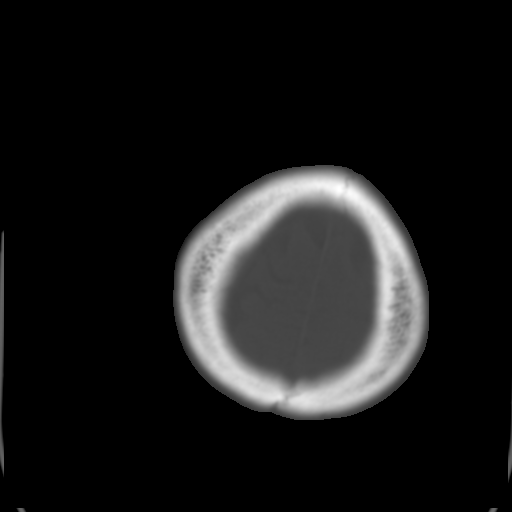

[Series 4: head bone · axial · 0.43mm/px · z∈[-70,-0]mm · 5 of 71 slices shown]
[im 6/71  bone]
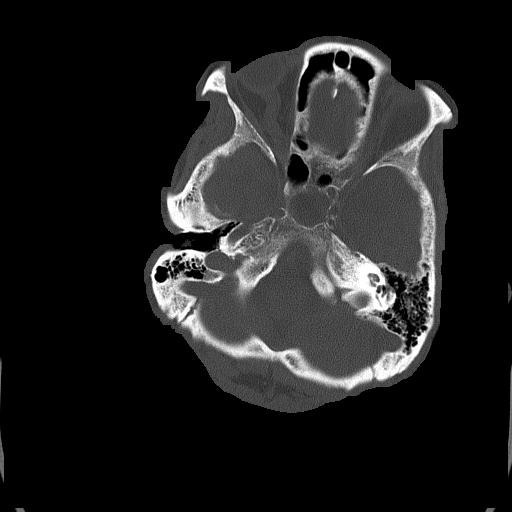
[im 16/71  bone]
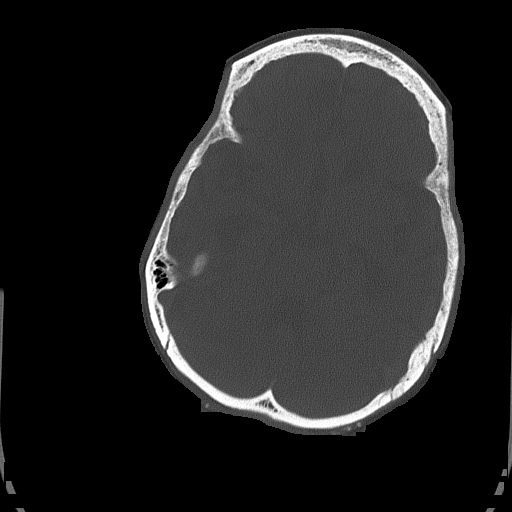
[im 26/71  bone]
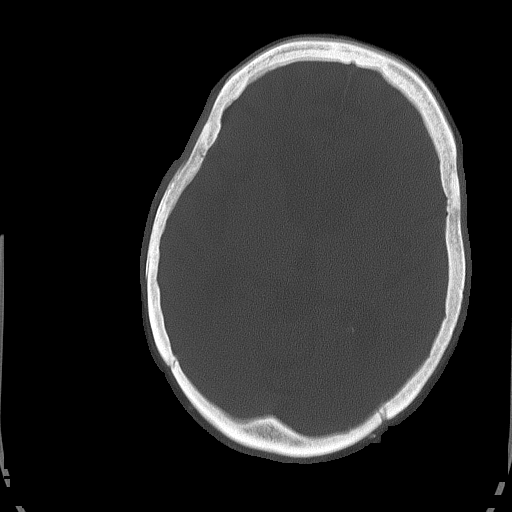
[im 31/71  bone]
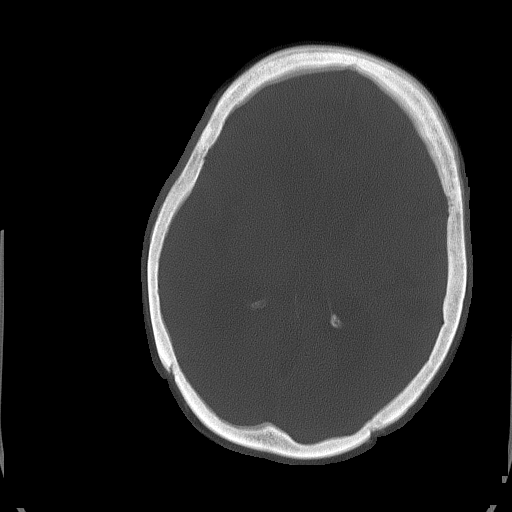
[im 41/71  bone]
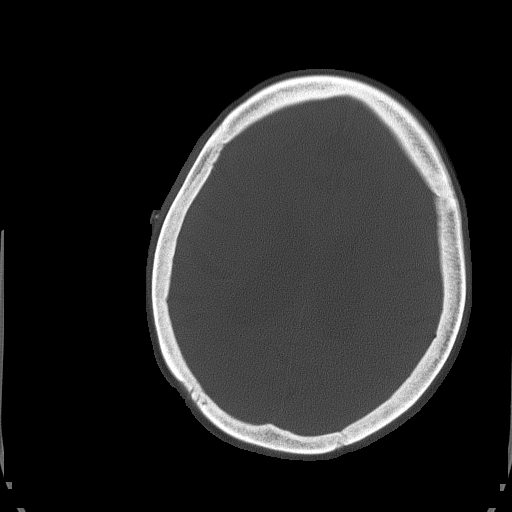

[Series 5: head without cor · coronal · non-contrast · 0.28mm/px · 3 of 67 slices shown]
[im 23/67  brain]
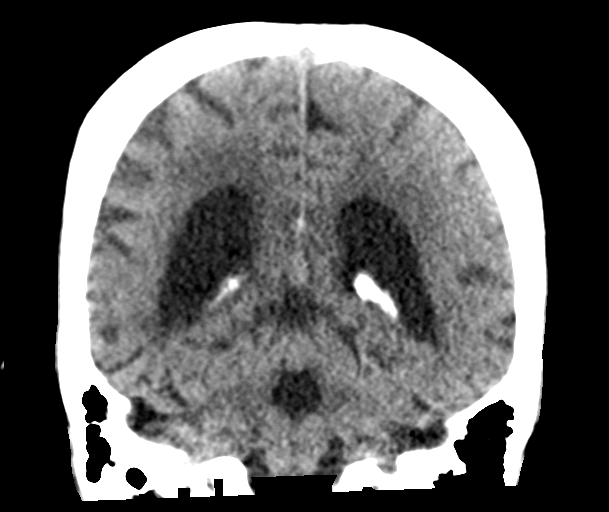
[im 30/67  brain]
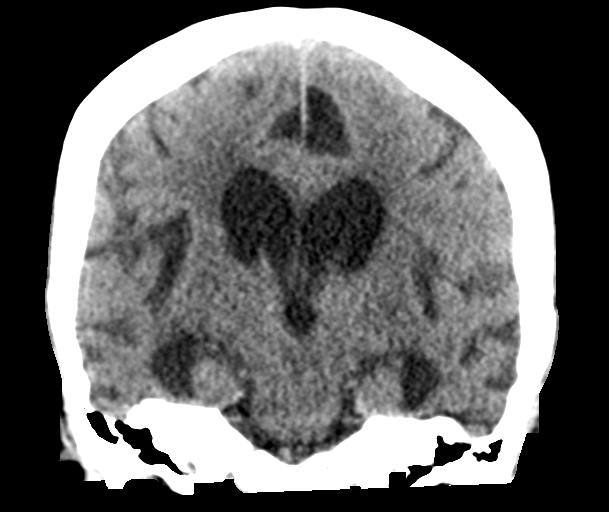
[im 37/67  brain]
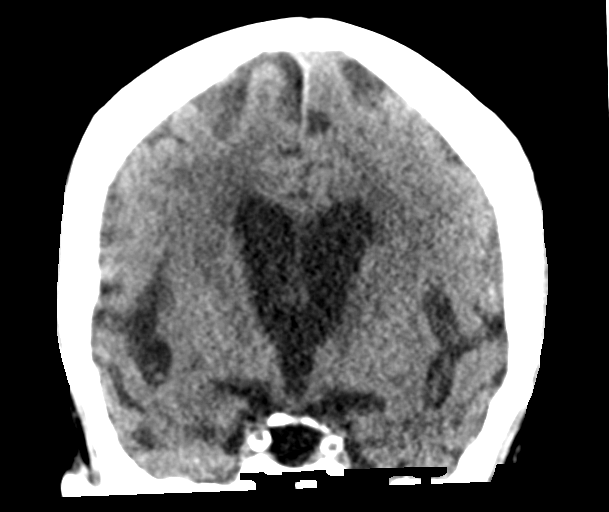

[Series 6: head without sag · sagittal · non-contrast · 0.31mm/px · 3 of 54 slices shown]
[im 18/54  brain]
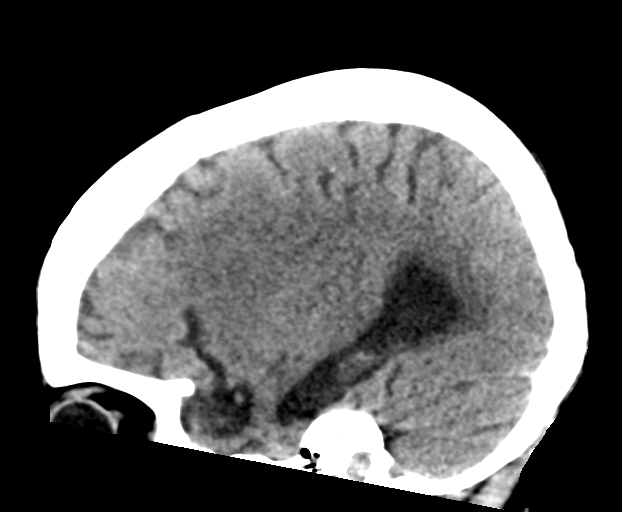
[im 27/54  brain]
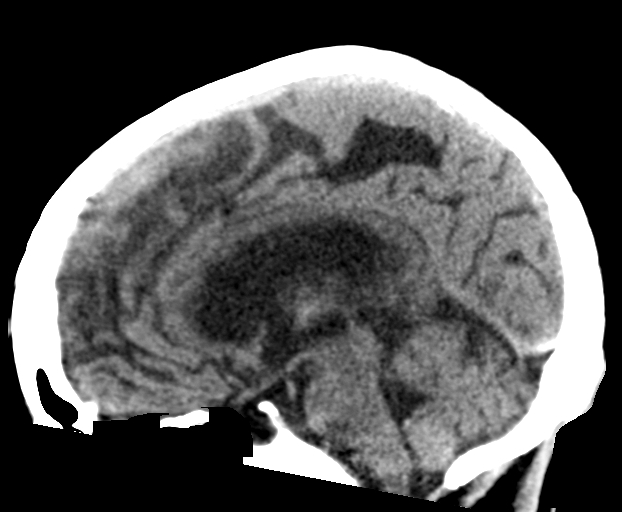
[im 36/54  brain]
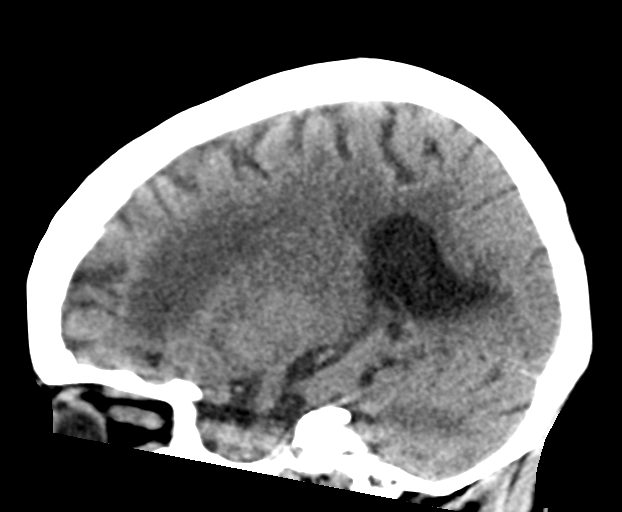

[16 of 47 positions shown; findings below may reference images not displayed]

FINDINGS: Brain: Moderate to advanced atrophy with ventricular enlargement.
This shows significant progression.

Extensive white matter hypodensity bilaterally with progression.
Empty sella again noted with enlargement of the sella filled with
CSF.

Negative for acute infarct, hemorrhage, or mass lesion.

Vascular: Atherosclerotic calcification. Negative for hyperdense
vessel

Skull: Negative

Sinuses/Orbits: Paranasal sinuses clear.  Bilateral lens replacement

Other: None
IMPRESSION: No acute intracranial abnormality

Advanced atrophy and chronic white matter changes with significant
progression since 05/16/2014

## 2018-05-21 IMAGING — CR DG CHEST 2V
2 series · 2 of 2 positions shown · non-contrast
Comparison: 10/02/2007 radiographs

CLINICAL DATA: Weakness and possible seizure today.

EXAM:
CHEST  2 VIEW

[chest lat]
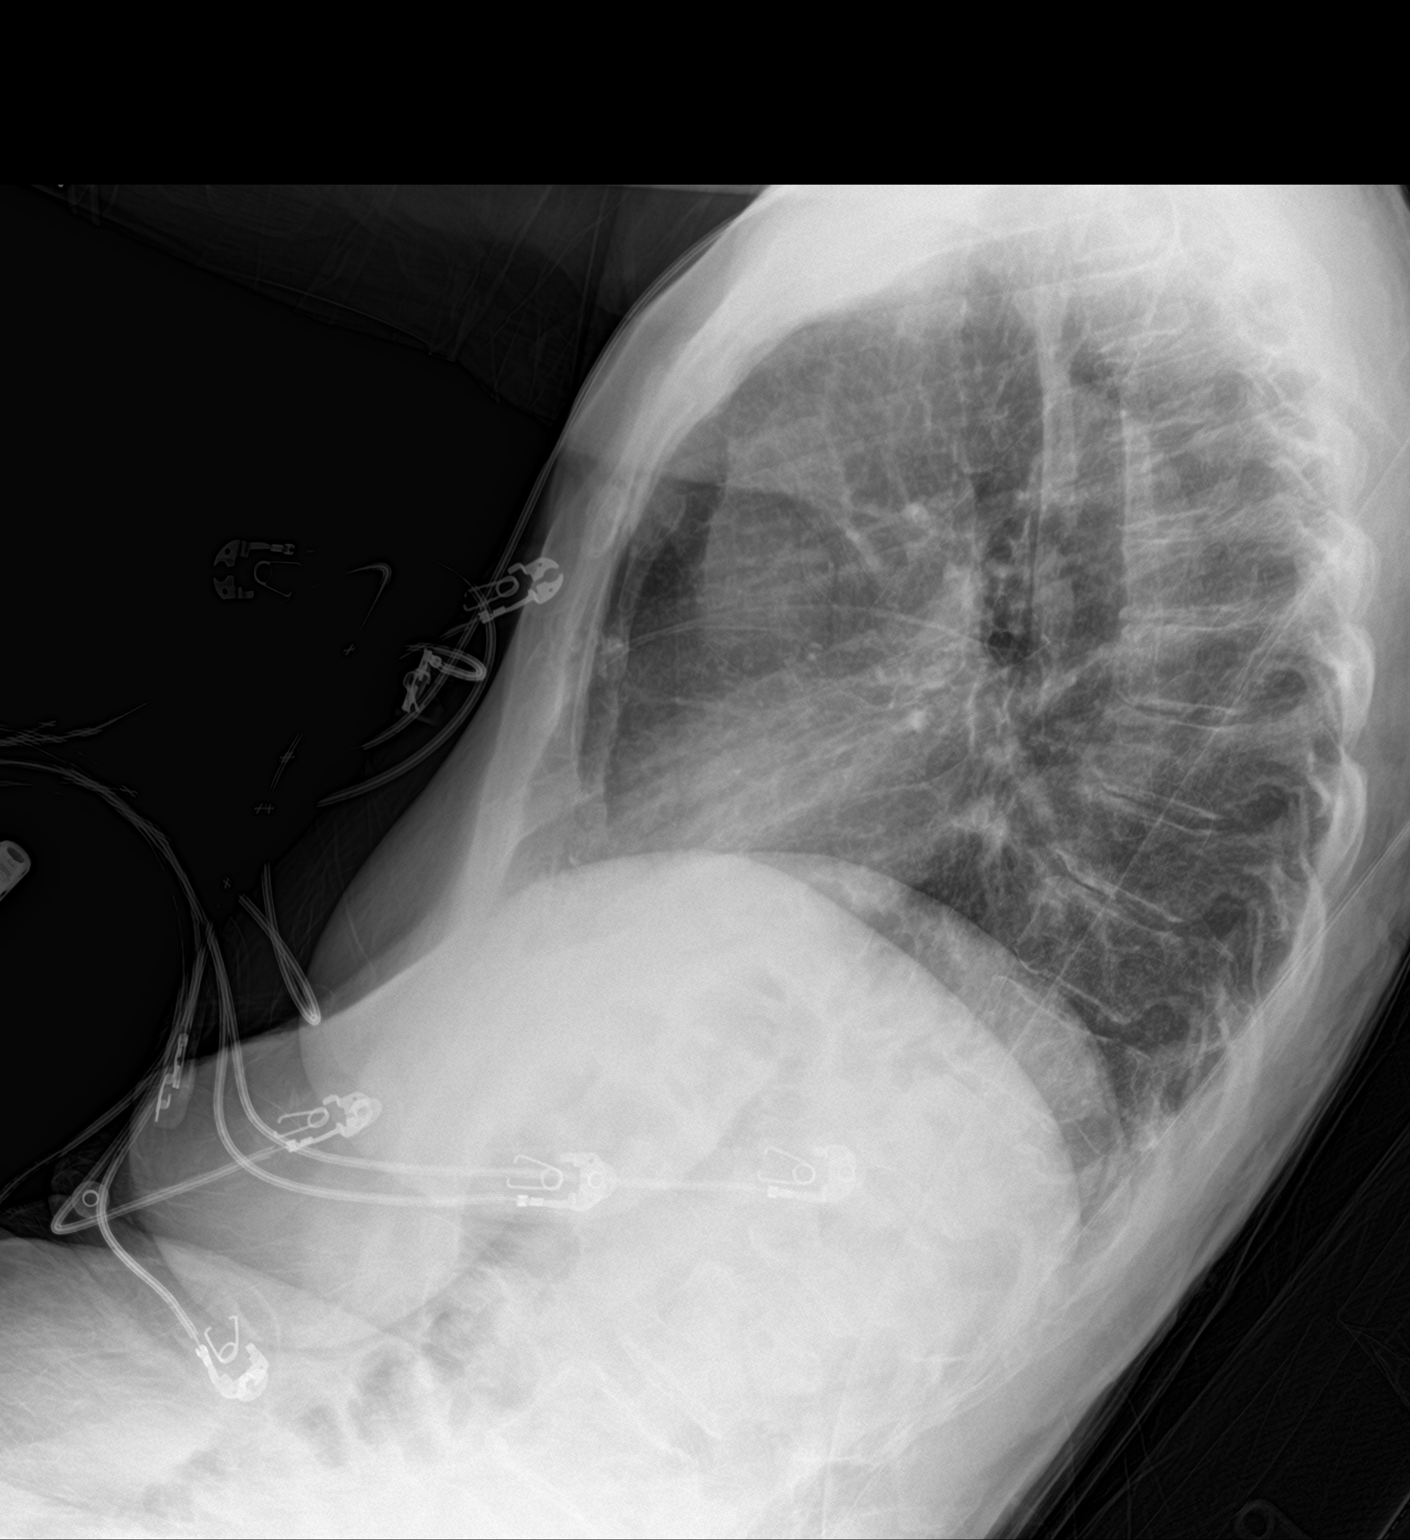

[chest ap]
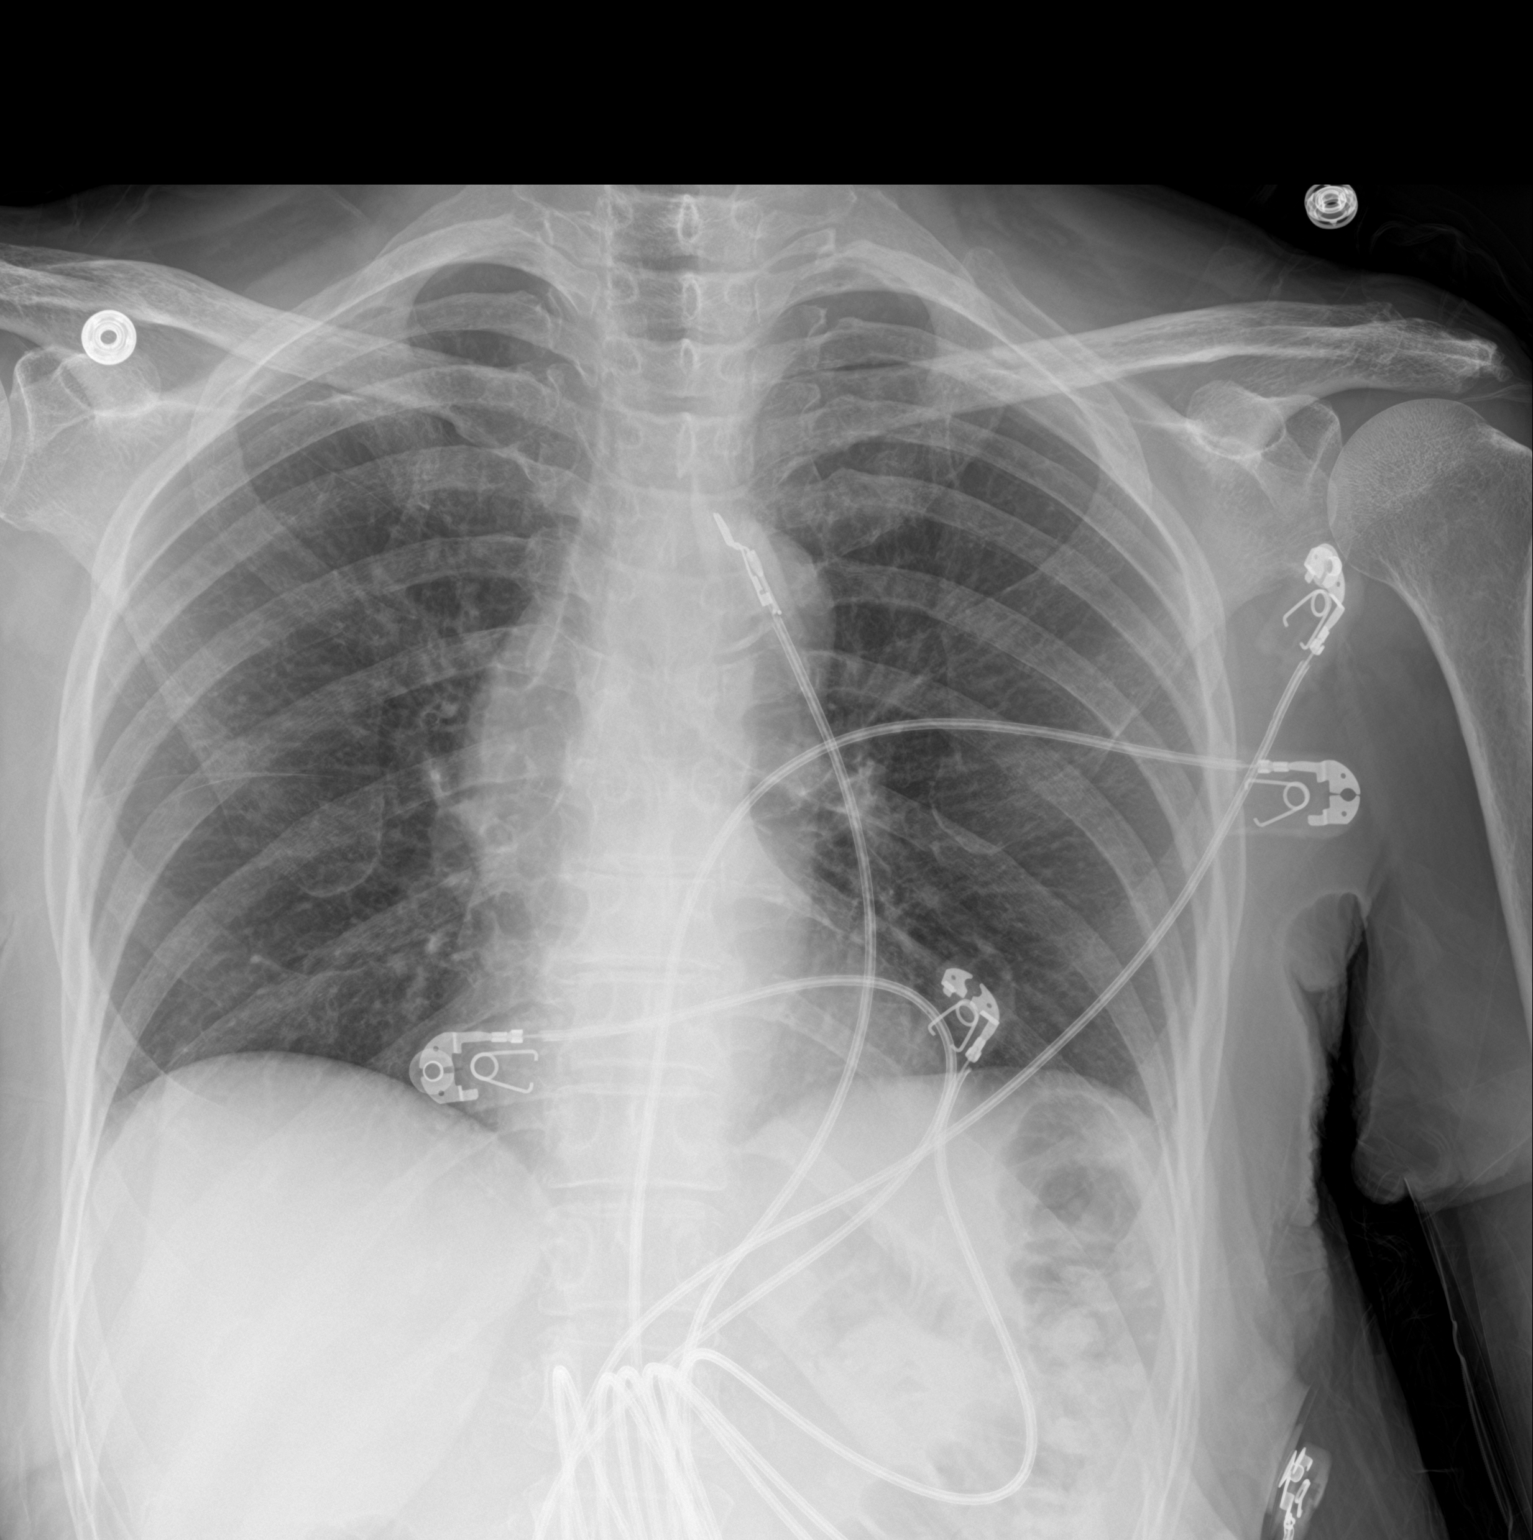

[2 of 2 positions shown; findings below may reference images not displayed]

FINDINGS: The cardiomediastinal silhouette is unremarkable.

There is no evidence of focal airspace disease, pulmonary edema,
suspicious pulmonary nodule/mass, pleural effusion, or pneumothorax.
No acute bony abnormalities are identified.
IMPRESSION: No active cardiopulmonary disease.
